# Patient Record
Sex: Male | Born: 1965 | Race: White | Hispanic: No | Marital: Single | Smoking: Current every day smoker
Health system: Southern US, Community
[De-identification: ages and names within clinical notes are randomized; demographics above are authoritative.]

---

## 2013-11-21 ENCOUNTER — Emergency Department (HOSPITAL_COMMUNITY)
Admission: EM | Admit: 2013-11-21 | Discharge: 2013-11-21 | Disposition: A | Discharge status: Home / Self Care | Payer: Self-pay | Attending: Emergency Medicine | Admitting: Emergency Medicine

## 2013-11-21 ENCOUNTER — Encounter (HOSPITAL_COMMUNITY): Payer: Self-pay | Admitting: Emergency Medicine

## 2013-11-21 ENCOUNTER — Emergency Department (HOSPITAL_COMMUNITY): Payer: Self-pay

## 2013-11-21 DIAGNOSIS — S59909A Unspecified injury of unspecified elbow, initial encounter: Secondary | ICD-10-CM | POA: Insufficient documentation

## 2013-11-21 DIAGNOSIS — M25519 Pain in unspecified shoulder: Secondary | ICD-10-CM

## 2013-11-21 DIAGNOSIS — Y9389 Activity, other specified: Secondary | ICD-10-CM | POA: Insufficient documentation

## 2013-11-21 DIAGNOSIS — S46909A Unspecified injury of unspecified muscle, fascia and tendon at shoulder and upper arm level, unspecified arm, initial encounter: Secondary | ICD-10-CM | POA: Insufficient documentation

## 2013-11-21 DIAGNOSIS — S6980XA Other specified injuries of unspecified wrist, hand and finger(s), initial encounter: Secondary | ICD-10-CM | POA: Insufficient documentation

## 2013-11-21 DIAGNOSIS — W108XXA Fall (on) (from) other stairs and steps, initial encounter: Secondary | ICD-10-CM | POA: Insufficient documentation

## 2013-11-21 DIAGNOSIS — S298XXA Other specified injuries of thorax, initial encounter: Secondary | ICD-10-CM | POA: Insufficient documentation

## 2013-11-21 DIAGNOSIS — S4980XA Other specified injuries of shoulder and upper arm, unspecified arm, initial encounter: Secondary | ICD-10-CM | POA: Insufficient documentation

## 2013-11-21 DIAGNOSIS — M79643 Pain in unspecified hand: Secondary | ICD-10-CM

## 2013-11-21 DIAGNOSIS — W101XXA Fall (on)(from) sidewalk curb, initial encounter: Secondary | ICD-10-CM | POA: Insufficient documentation

## 2013-11-21 DIAGNOSIS — F17200 Nicotine dependence, unspecified, uncomplicated: Secondary | ICD-10-CM | POA: Insufficient documentation

## 2013-11-21 DIAGNOSIS — M25529 Pain in unspecified elbow: Secondary | ICD-10-CM

## 2013-11-21 DIAGNOSIS — T07XXXA Unspecified multiple injuries, initial encounter: Secondary | ICD-10-CM

## 2013-11-21 DIAGNOSIS — S6990XA Unspecified injury of unspecified wrist, hand and finger(s), initial encounter: Secondary | ICD-10-CM | POA: Insufficient documentation

## 2013-11-21 DIAGNOSIS — Y9289 Other specified places as the place of occurrence of the external cause: Secondary | ICD-10-CM | POA: Insufficient documentation

## 2013-11-21 DIAGNOSIS — S59919A Unspecified injury of unspecified forearm, initial encounter: Secondary | ICD-10-CM

## 2013-11-21 MED ORDER — OXYCODONE-ACETAMINOPHEN 5-325 MG PO TABS: 1.0000 | ORAL_TABLET | ORAL | Status: AC | PRN

## 2013-11-21 MED ORDER — IBUPROFEN 400 MG PO TABS
600.0000 mg | ORAL_TABLET | Freq: Once | ORAL | Status: AC
  Administered 2013-11-21: 600 mg via ORAL
  Filled 2013-11-21: qty 2

## 2013-11-21 MED ORDER — HYDROMORPHONE HCL PF 1 MG/ML IJ SOLN
1.0000 mg | Freq: Once | INTRAMUSCULAR | Status: AC
  Administered 2013-11-21: 1 mg via INTRAMUSCULAR
  Filled 2013-11-21: qty 1

## 2013-11-21 MED ORDER — OXYCODONE-ACETAMINOPHEN 5-325 MG PO TABS: 1.0000 | ORAL_TABLET | ORAL | Status: DC | PRN

## 2013-11-21 MED ORDER — LORAZEPAM 1 MG PO TABS
0.5000 mg | ORAL_TABLET | Freq: Once | ORAL | Status: DC
  Filled 2013-11-21: qty 1

## 2013-11-21 NOTE — ED Provider Notes (Signed)
CSN: [id number]     Arrival date & time 11/21/13  1405 History   First MD Initiated Contact with Patient 11/21/13 1550 This chart was scribed for Raeford Razor, MD by Valera Castle, ED Scribe. This patient was seen in room [facility]18/[facility]18 and the patient's care was started at 4:06 PM.   Chief Complaint  Patient presents with  . Fall   (Consider location/radiation/quality/duration/timing/severity/associated sxs/prior Treatment) The history is provided by the patient. No language interpreter was used.   HPI Comments: Lindbergh Winkles is a 48 y.o. male who presents to the Emergency Department complaining of a fall, onset earlier today after slipping on a step and landing on a sidewalk on his left shoulder and right arm. He reports constant pain in his left shoulder, right elbow, and right hand and thumb. He also reports left sided, upper rib pain. He denies taking any pain medication PTA. He denies prior h/o shoulder injury. He denies LOC, wounds, numbness, tingling, and any other associated symptoms.   History reviewed. No pertinent past medical history. History reviewed. No pertinent past surgical history. History reviewed. No pertinent family history. History  Substance Use Topics  . Smoking status: Current Every Day Smoker  . Smokeless tobacco: Not on file  . Alcohol Use: No    Review of Systems  All other systems reviewed and are negative.   Allergies  Review of patient's allergies indicates no known allergies.  Home Medications  No current outpatient prescriptions on file.  BP 149/93  Pulse 94  Temp(Src) 98.2 F (36.8 C) (Oral)  Resp 20  Ht 6\' 2"  (1.88 m)  Wt 280 lb (127.007 kg)  BMI 35.93 kg/m2  SpO2 99%  Physical Exam  Nursing note and vitals reviewed. Constitutional: He is oriented to person, place, and time. He appears well-developed and well-nourished. He appears distressed.  HENT:  Head: Normocephalic and atraumatic.  Eyes: EOM are normal.  Neck: Neck supple.   Cardiovascular: Normal rate, regular rhythm and normal heart sounds.   No murmur heard. Pulmonary/Chest: Effort normal and breath sounds normal. No respiratory distress. He has no wheezes. He has no rales.  Musculoskeletal: Normal range of motion. He exhibits tenderness. He exhibits no edema.  Tenderness over right olacrenon. Tenderness in right snuff box. Tenderness over proximal phalanx right 3rd finger. Tenderness along left clavicle, left ac joint. Tenderness left anterior chest wall.   Neurological: He is alert and oriented to person, place, and time.  Neurovascular intact.  Skin: Skin is warm and dry.  Psychiatric: He has a normal mood and affect. His behavior is normal.   ED Course  Procedures (including critical care time)  DIAGNOSTIC STUDIES: Oxygen Saturation is 99% on room air, normal by my interpretation.    COORDINATION OF CARE: 4:11 PM-Discussed treatment plan with pt at bedside and pt agreed to plan. Discussed imaging results with pt. Will give pt pain medication. Ordered wrist splint, will give pt sling.   No results found for this or any previous visit. Dg Ribs Unilateral W/chest Left  11/21/2013   CLINICAL DATA:  Fall  EXAM: LEFT RIBS AND CHEST - 3+ VIEW  COMPARISON:  None.  FINDINGS: No fracture or other bone lesions are seen involving the ribs. There is no evidence of pneumothorax or pleural effusion. Both lungs are clear. Heart size and mediastinal contours are within normal limits.  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  IMPRESSION: Negative.   Electronically Signed   By: Marlan Palau M.D.  On: 11/21/2013 16:15   Dg Elbow Complete Right  11/21/2013   CLINICAL DATA:  Fall  EXAM: RIGHT ELBOW - COMPLETE 3+ VIEW  COMPARISON:  None.  FINDINGS: There is no evidence of fracture, dislocation, or joint effusion. There is no evidence of arthropathy or other focal bone abnormality. Soft tissues are unremarkable.  IMPRESSION: Negative.   Electronically  Signed   By: Marlan Palauharles  Clark M.D.   On: 11/21/2013 16:11   Dg Shoulder Left  11/21/2013   CLINICAL DATA:  Fall  EXAM: LEFT SHOULDER - 2+ VIEW  COMPARISON:  None.  FINDINGS: Negative for acute fracture. There is resorption of the distal clavicle which is likely due to old injury or surgery.  IMPRESSION: No acute abnormality.   Electronically Signed   By: Marlan Palauharles  Clark M.D.   On: 11/21/2013 16:12   Dg Hand Complete Right  11/21/2013   CLINICAL DATA:  Fall  EXAM: RIGHT HAND - COMPLETE 3+ VIEW  COMPARISON:  None.  FINDINGS: There is no evidence of fracture or dislocation. There is no evidence of arthropathy or other focal bone abnormality. Soft tissues are unremarkable.  IMPRESSION: Negative.   Electronically Signed   By: Marlan Palauharles  Clark M.D.   On: 11/21/2013 16:10    EKG Interpretation None     Medications  LORazepam (ATIVAN) tablet 0.5 mg (0.5 mg Oral Not Given 11/21/13 1626)  HYDROmorphone (DILAUDID) injection 1 mg (1 mg Intramuscular Given 11/21/13 1624)  ibuprofen (ADVIL,MOTRIN) tablet 600 mg (600 mg Oral Given 11/21/13 1623)    MDM   Final diagnoses:  Elbow pain  Hand pain  Shoulder pain  Multiple contusions    47yM with multiple contusions. There is a radiated to [place] without radiographic evidence of a scaphoid or other osseous injury. Will splint and followup with orthopedics. Patient with tenderness over the left a.c. joint. Imaging is more consistent with a chronic process with bone resorption. No acute fracture or. Sling for comfort. As needed pain medication. 2 followup.  I personally preformed the services scribed in my presence. The recorded information has been reviewed is accurate. Raeford RazorStephen Heston Widener, MD.    Raeford RazorStephen Derica Leiber, MD 11/24/13 [phone]

## 2013-11-21 NOTE — ED Notes (Addendum)
Fell down 7 steps, Pain lt shoulder,rt elbow, rt hand lt ribs  No LOC     No HI, denies neck or back pain  Good bil radial pulses,  Pt cannot extend rt elbow.

## 2013-11-21 NOTE — Discharge Instructions (Signed)

## 2015-10-11 IMAGING — CR DG RIBS W/ CHEST 3+V*L*
4 series · 4 of 4 positions shown · non-contrast
Comparison: None.

CLINICAL DATA: Fall

EXAM:
LEFT RIBS AND CHEST - 3+ VIEW

[view not recorded (1 of 4)]
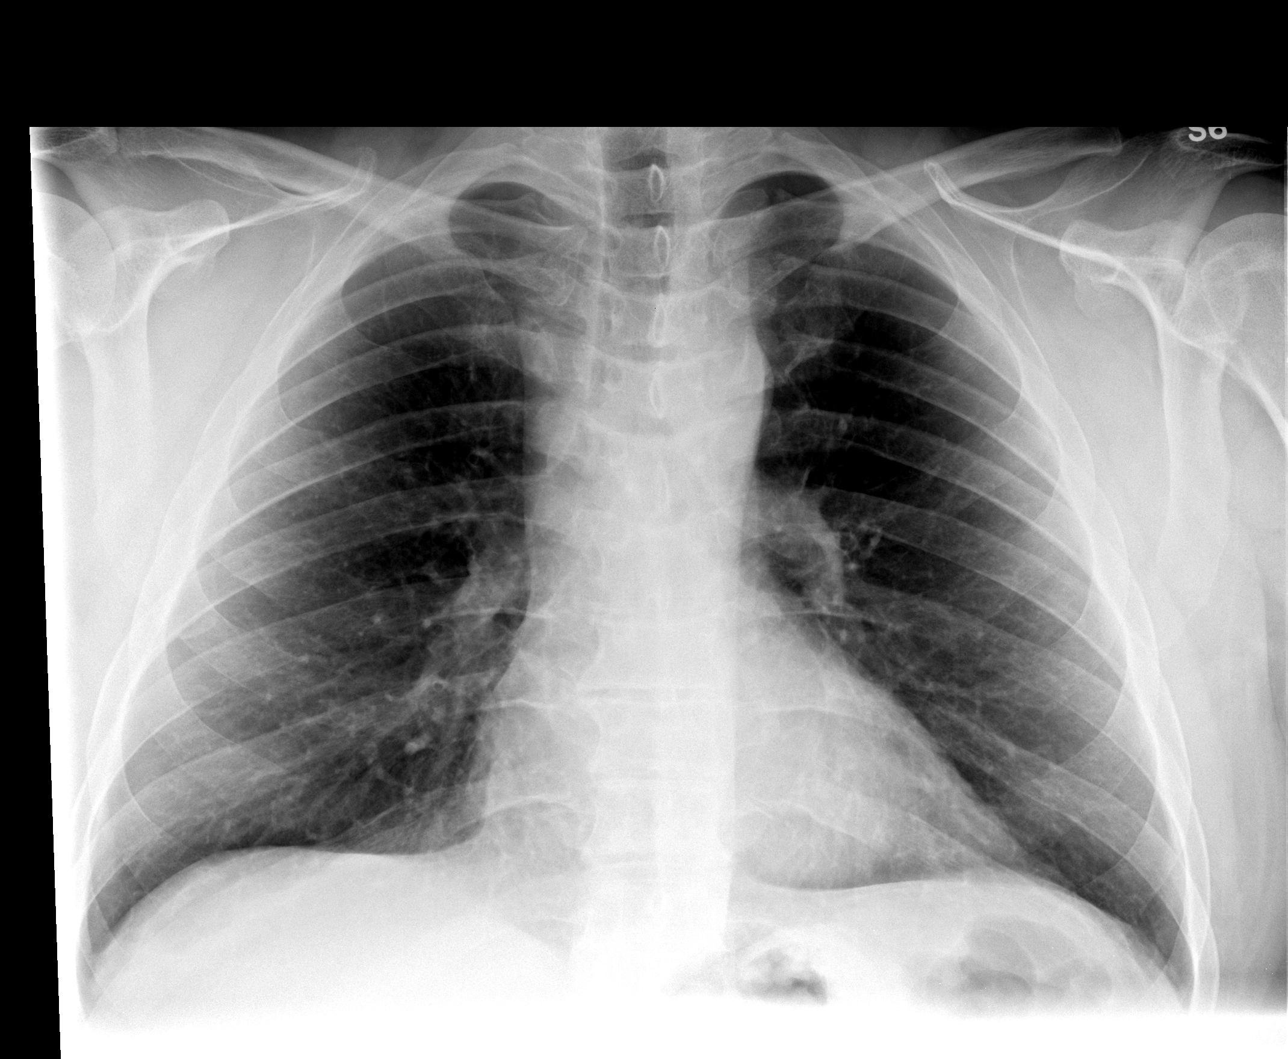

[view not recorded (2 of 4)]
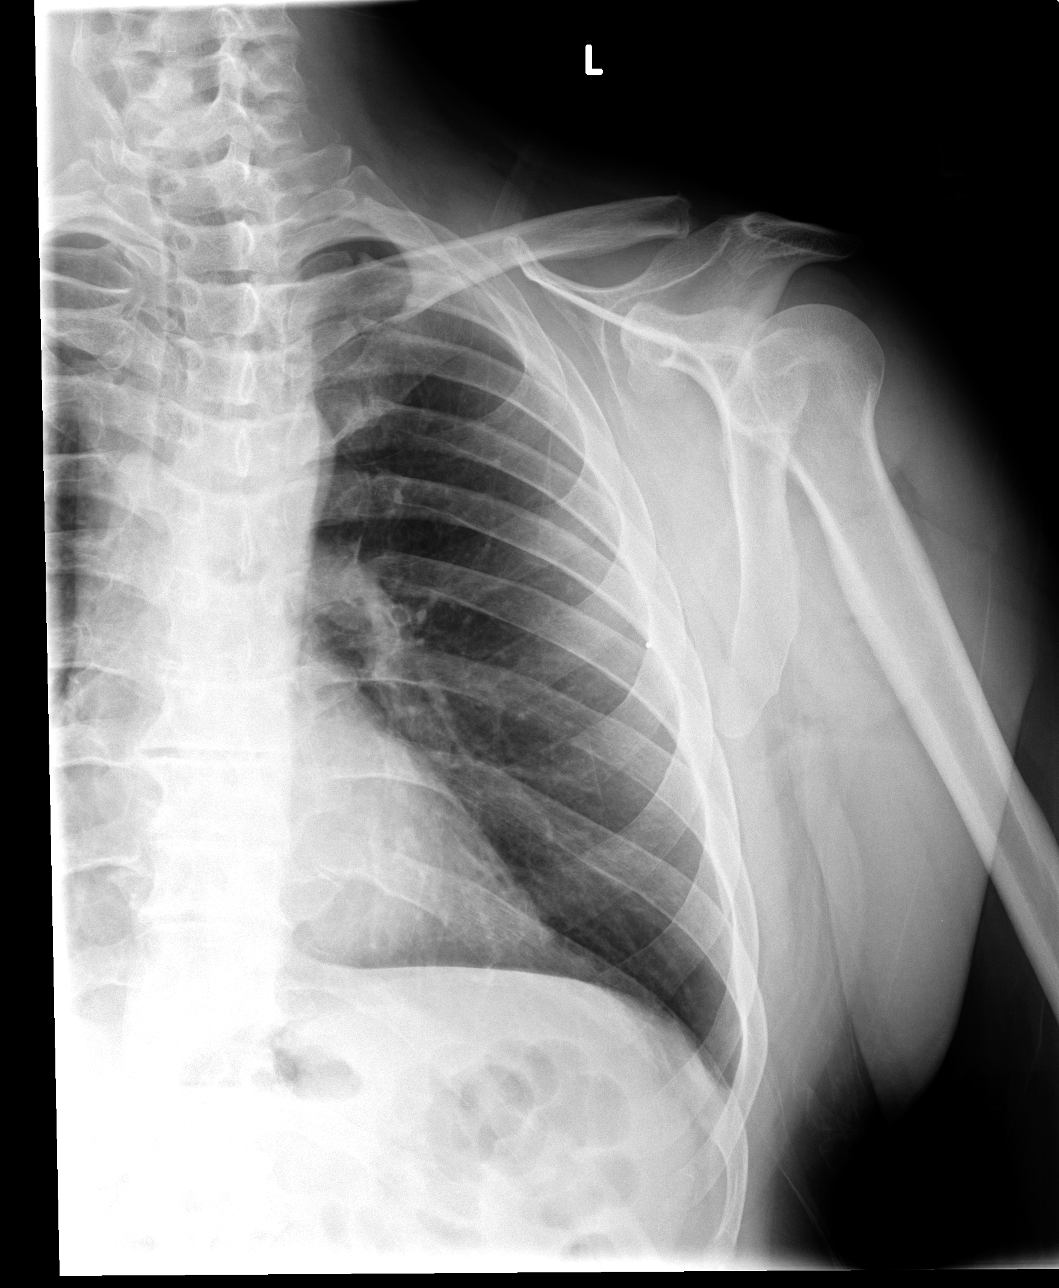

[view not recorded (3 of 4)]
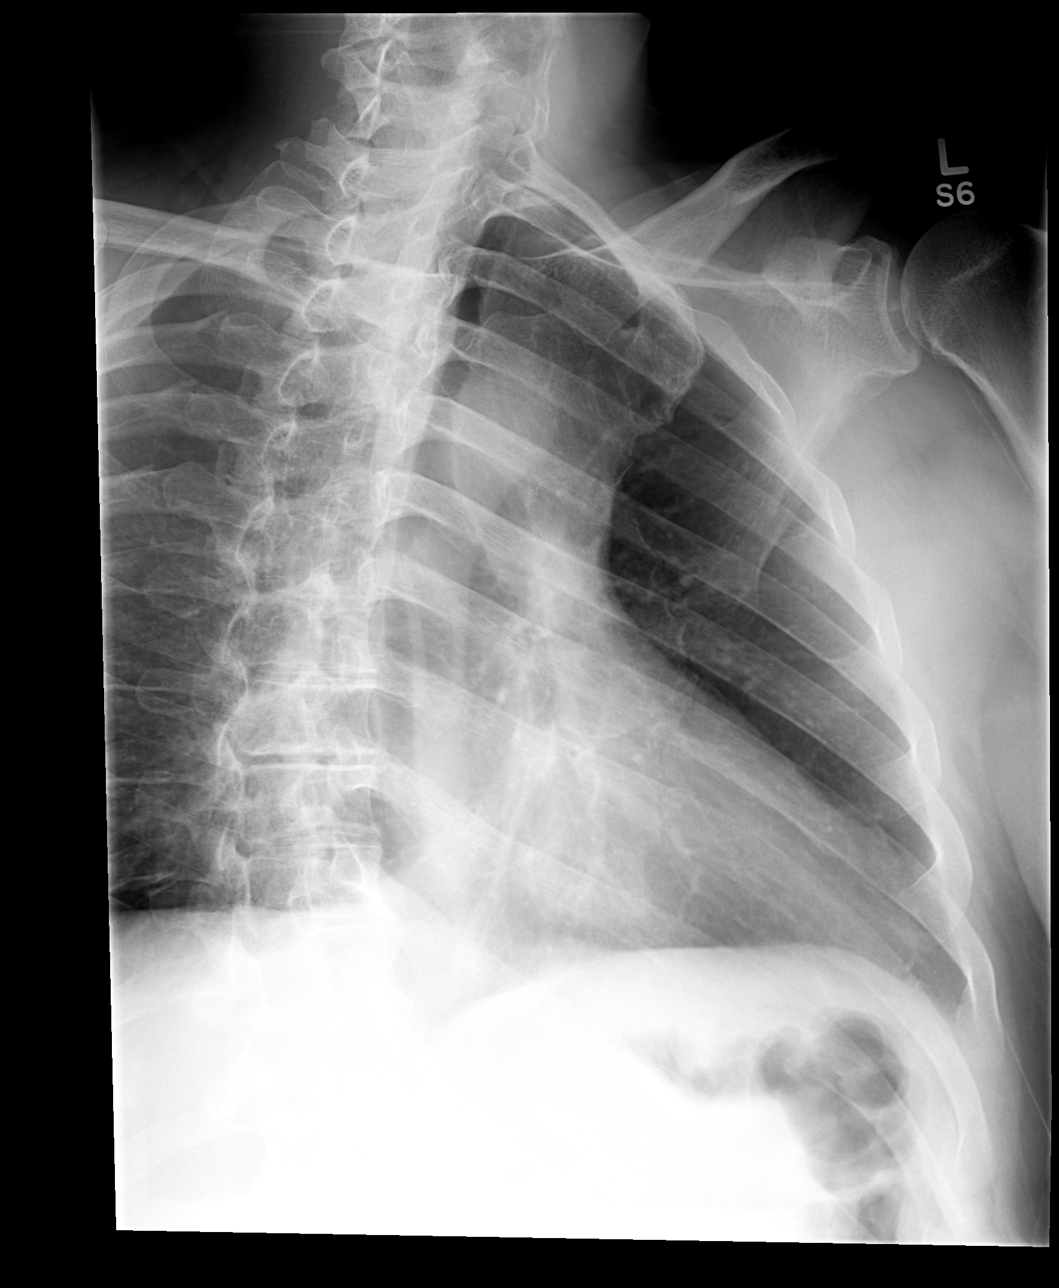

[view not recorded (4 of 4)]
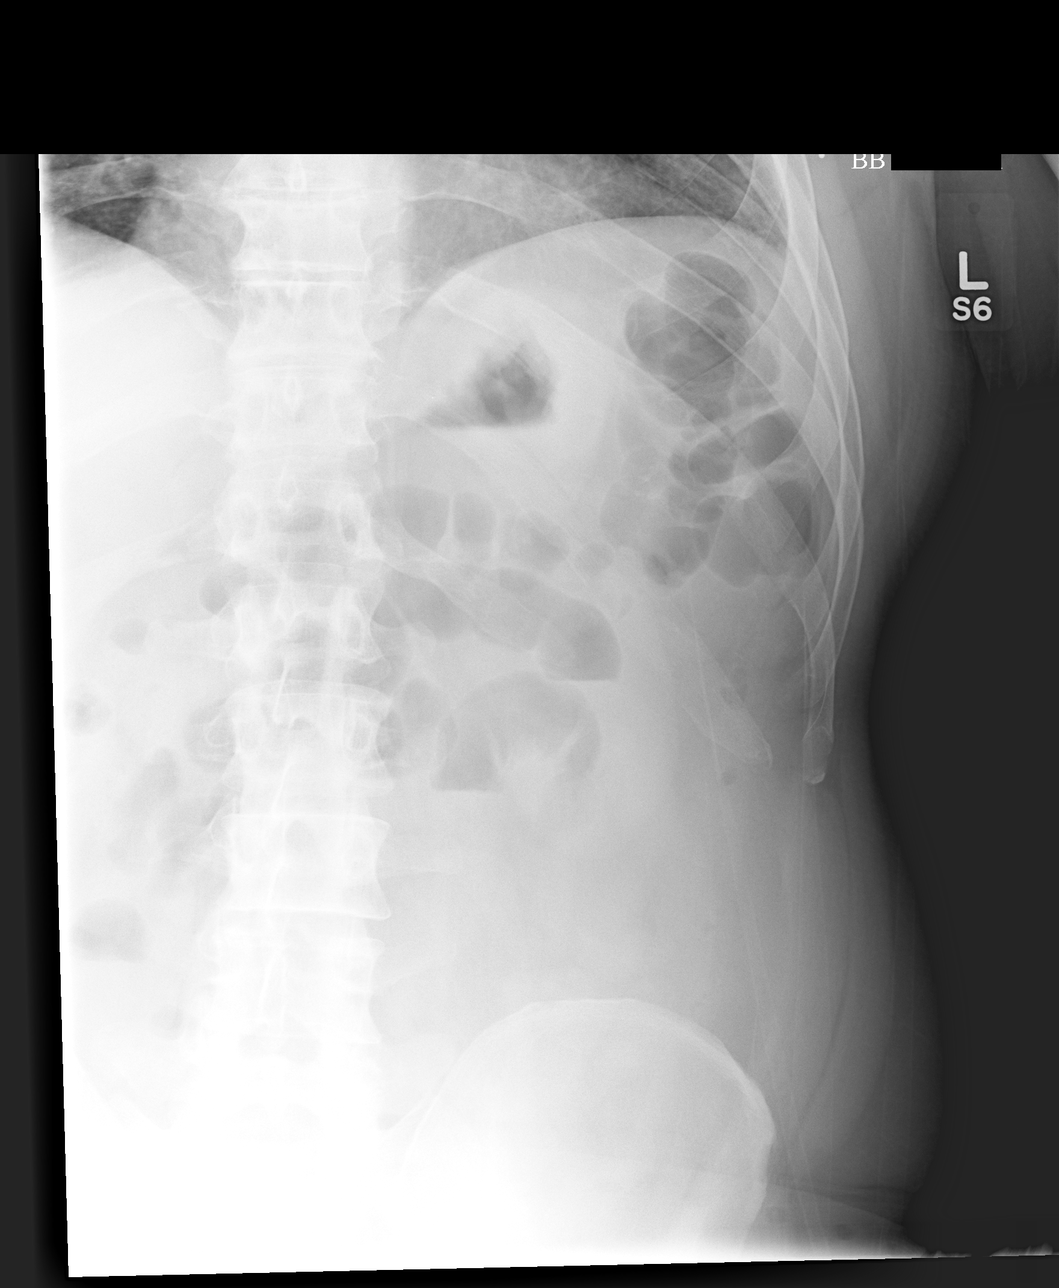

[4 of 4 positions shown; findings below may reference images not displayed]

FINDINGS: No fracture or other bone lesions are seen involving the ribs. There
is no evidence of pneumothorax or pleural effusion. Both lungs are
clear. Heart size and mediastinal contours are within normal limits.

Resorption of the distal left clavicle with a chronic appearance.
IMPRESSION: Negative.
